# Patient Record
Sex: Male | Born: 1985 | Race: White | Hispanic: No | Marital: Single | Smoking: Never smoker
Health system: Southern US, Community
[De-identification: ages and names within clinical notes are randomized; demographics above are authoritative.]

## PROBLEM LIST (undated history)

## (undated) DIAGNOSIS — J45909 Unspecified asthma, uncomplicated: Secondary | ICD-10-CM

## (undated) DIAGNOSIS — K219 Gastro-esophageal reflux disease without esophagitis: Secondary | ICD-10-CM

## (undated) HISTORY — DX: Gastro-esophageal reflux disease without esophagitis: K21.9

## (undated) HISTORY — PX: KNEE SURGERY: SHX244

## (undated) HISTORY — DX: Unspecified asthma, uncomplicated: J45.909

## (undated) HISTORY — PX: CARDIAC ELECTROPHYSIOLOGY MAPPING AND ABLATION: SHX1292

---

## 2013-03-27 ENCOUNTER — Emergency Department (HOSPITAL_COMMUNITY): Payer: Worker's Compensation

## 2013-03-27 ENCOUNTER — Emergency Department (HOSPITAL_COMMUNITY)
Admission: EM | Admit: 2013-03-27 | Discharge: 2013-03-27 | Disposition: A | Discharge status: Home / Self Care | Payer: Worker's Compensation | Attending: Emergency Medicine | Admitting: Emergency Medicine

## 2013-03-27 DIAGNOSIS — IMO0002 Reserved for concepts with insufficient information to code with codable children: Secondary | ICD-10-CM | POA: Insufficient documentation

## 2013-03-27 DIAGNOSIS — Z880 Allergy status to penicillin: Secondary | ICD-10-CM | POA: Insufficient documentation

## 2013-03-27 DIAGNOSIS — Z79899 Other long term (current) drug therapy: Secondary | ICD-10-CM | POA: Insufficient documentation

## 2013-03-27 DIAGNOSIS — R0781 Pleurodynia: Secondary | ICD-10-CM

## 2013-03-27 DIAGNOSIS — T148XXA Other injury of unspecified body region, initial encounter: Secondary | ICD-10-CM

## 2013-03-27 DIAGNOSIS — W1789XA Other fall from one level to another, initial encounter: Secondary | ICD-10-CM | POA: Insufficient documentation

## 2013-03-27 DIAGNOSIS — S20219A Contusion of unspecified front wall of thorax, initial encounter: Secondary | ICD-10-CM | POA: Insufficient documentation

## 2013-03-27 DIAGNOSIS — S298XXA Other specified injuries of thorax, initial encounter: Secondary | ICD-10-CM | POA: Insufficient documentation

## 2013-03-27 DIAGNOSIS — Y929 Unspecified place or not applicable: Secondary | ICD-10-CM | POA: Insufficient documentation

## 2013-03-27 DIAGNOSIS — Y9389 Activity, other specified: Secondary | ICD-10-CM | POA: Insufficient documentation

## 2013-03-27 DIAGNOSIS — W19XXXA Unspecified fall, initial encounter: Secondary | ICD-10-CM

## 2013-03-27 MED ORDER — ONDANSETRON HCL 4 MG/2ML IJ SOLN
4.0000 mg | Freq: Once | INTRAMUSCULAR | Status: AC
  Administered 2013-03-27: 4 mg via INTRAVENOUS
  Filled 2013-03-27: qty 2

## 2013-03-27 MED ORDER — KETOROLAC TROMETHAMINE 30 MG/ML IJ SOLN
30.0000 mg | Freq: Once | INTRAMUSCULAR | Status: AC
  Administered 2013-03-27: 30 mg via INTRAVENOUS
  Filled 2013-03-27: qty 1

## 2013-03-27 MED ORDER — HYDROCODONE-ACETAMINOPHEN 5-325 MG PO TABS: 1.0000 | ORAL_TABLET | Freq: Four times a day (QID) | ORAL | Status: DC | PRN

## 2013-03-27 MED ORDER — SODIUM CHLORIDE 0.9 % IV BOLUS (SEPSIS)
1000.0000 mL | Freq: Once | INTRAVENOUS | Status: AC
  Administered 2013-03-27: 1000 mL via INTRAVENOUS

## 2013-03-27 MED ORDER — ONDANSETRON HCL 4 MG PO TABS: 4.0000 mg | ORAL_TABLET | Freq: Four times a day (QID) | ORAL | Status: DC

## 2013-03-27 NOTE — ED Provider Notes (Signed)
CSN: [id number]     Arrival date & time 03/27/13  1302 History   First MD Initiated Contact with Patient 03/27/13 1304     Chief Complaint  Patient presents with  . Fall   (Consider location/radiation/quality/duration/timing/severity/associated sxs/prior Treatment) The history is provided by the patient. No language interpreter was used.  Roberto Banks is a 28 y/o M with no known significant PMHx presenting to the ED with a mechanical fall that occurred this afternoon. Patient reported that he was blowing leaves off the roof, lost his footing and ended up falling off the roof approximately 14 feet. Patient reported that on his was down he landed on the railing on his ribs on the left side. Patient reported that the pain feels like he broke his ribs. Patient reported that there is a throbbing pain with a sharp, shooting pain when the patient takes a deep inhalation. Patient reported that he has mild right bicep discomfort. Patient denied head injury, LOC, dizziness, visual changes, blurred vision, sudden loss of vision, nausea, vomiting, back pain, abdominal pain, abdominal trauma, weakness, numbness, tingling.  PCP none  No past medical history on file. No past surgical history on file. No family history on file. History  Substance Use Topics  . Smoking status: Not on file  . Smokeless tobacco: Not on file  . Alcohol Use: Not on file    Review of Systems  Constitutional: Negative for fever and chills.  HENT: Negative for trouble swallowing.   Eyes: Negative for visual disturbance.  Respiratory: Negative for chest tightness and shortness of breath.   Cardiovascular: Positive for chest pain (Rib pain-localize the left side).  Gastrointestinal: Negative for nausea, vomiting, abdominal pain and diarrhea.  Musculoskeletal: Negative for back pain and neck pain.  Neurological: Negative for dizziness, weakness and numbness.  All other systems reviewed and are negative.    Allergies    Penicillins  Home Medications   Current Outpatient Rx  Name  Route  Sig  Dispense  Refill  . RABEprazole (ACIPHEX) 20 MG tablet   Oral   Take 20 mg by mouth daily.         Marland Kitchen. HYDROcodone-acetaminophen (NORCO/VICODIN) 5-325 MG per tablet   Oral   Take 1 tablet by mouth every 6 (six) hours as needed.   11 tablet   0   . ondansetron (ZOFRAN) 4 MG tablet   Oral   Take 1 tablet (4 mg total) by mouth every 6 (six) hours.   12 tablet   0    BP 131/49  Pulse 92  Temp(Src) 98.2 F (36.8 C) (Oral)  Resp 18  Ht 6\' 2"  (1.88 m)  Wt 255 lb (115.667 kg)  BMI 32.73 kg/m2  SpO2 100% Physical Exam  Nursing note and vitals reviewed. Constitutional: He is oriented to person, place, and time. He appears well-developed and well-nourished. No distress.  HENT:  Head: Normocephalic and atraumatic. Not macrocephalic and not microcephalic. Head is without raccoon's eyes, without Battle's sign, without abrasion, without contusion and without laceration.  Negative facial trauma identified Negative trismus Negative palpation of hematomas  Eyes: Conjunctivae and EOM are normal. Pupils are equal, round, and reactive to light. Right eye exhibits no discharge. Left eye exhibits no discharge.  Negative nystagmus Visual fields intact  Neck: Normal range of motion. No tracheal deviation present.  Patient placed in c-collar Negative pain upon palpation to the C-spine  Cardiovascular: Normal rate, regular rhythm and normal heart sounds.  Exam reveals no friction rub.  No murmur heard. Pulses:      Radial pulses are 2+ on the right side, and 2+ on the left side.       Dorsalis pedis pulses are 2+ on the right side, and 2+ on the left side.  Cap refill less than 3 seconds  Pulmonary/Chest: Effort normal and breath sounds normal. No respiratory distress. He has no wheezes. He has no rales. He exhibits tenderness.  Pain upon palpation to the left ribs, posterior aspects Negative crepitus upon  palpation Good lung expansion Negative respiratory distress Negative stridor Patient able to speak in full sentences without difficulty  Musculoskeletal: Normal range of motion. He exhibits no tenderness.       Back:  Full ROM to upper and lower extremities without difficulty noted, negative ataxia noted.  Neurological: He is alert and oriented to person, place, and time. No cranial nerve deficit. He exhibits normal muscle tone. Coordination normal.  Cranial nerves III-XII grossly intact Strength 5+/5+ to upper and lower extremities bilaterally with resistance applied, equal distribution noted Strength intact to MCP, PIP, DIP joints of bilateral hand Sensation intact with differentiation to sharp and dull touch Negative arm drift Negative pronator drift - negative Romberg Negative slurred speech Fine motor skills intact Gait proper, proper balance - negative sway, negative drift, negative step-offs  Skin: Skin is warm and dry. No rash noted. He is not diaphoretic. No erythema.  Superficial abrasion localized to the medial aspect of the right bicep  Psychiatric: He has a normal mood and affect. His behavior is normal. Thought content normal.    ED Course  Procedures (including critical care time)  Dg Chest 2 View  03/27/2013   CLINICAL DATA:  Fall.  Chest pain  EXAM: CHEST  2 VIEW  COMPARISON:  None.  FINDINGS: The heart size and mediastinal contours are within normal limits. Both lungs are clear. The visualized skeletal structures are unremarkable.  IMPRESSION: No active cardiopulmonary disease.   Electronically Signed   By: Marlan Palau M.D.   On: 03/27/2013 14:57   Dg Ribs Unilateral Left  03/27/2013   CLINICAL DATA:  Fall.  Pain  EXAM: LEFT RIBS - 2 VIEW  COMPARISON:  None.  FINDINGS: No fracture or other bone lesions are seen involving the ribs. The lower ribs were imaged in the area of symptoms.  IMPRESSION: Negative.   Electronically Signed   By: Marlan Palau M.D.   On:  03/27/2013 14:56   Dg Pelvis 1-2 Views  03/27/2013   CLINICAL DATA:  Fall.  EXAM: PELVIS - 1-2 VIEW  COMPARISON:  None.  FINDINGS: Soft tissue structures are unremarkable. No evidence of fracture. No acute bony abnormality. Minimal degenerative changes both hips.  IMPRESSION: Minimal degenerative changes both hips.  No acute abnormality .   Electronically Signed   By: Maisie Fus  Register   On: 03/27/2013 14:54   Ct Head Wo Contrast  03/27/2013   CLINICAL DATA:  Fall.  EXAM: CT HEAD WITHOUT CONTRAST  CT CERVICAL SPINE WITHOUT CONTRAST  TECHNIQUE: Multidetector CT imaging of the head and cervical spine was performed following the standard protocol without intravenous contrast. Multiplanar CT image reconstructions of the cervical spine were also generated.  COMPARISON:  None.  FINDINGS: CT HEAD FINDINGS  No mass. No hydrocephalus. No hemorrhage. No focal bony abnormality. Mild mucosal thickening of the ethmoidal sinuses and maxillary sinuses. Small mucous retention cyst left maxillary sinus. Mastoids are clear.  CT CERVICAL SPINE FINDINGS  No acute soft tissue bony abnormality  identified. No evidence of fracture or dislocation. Good anatomic alignment is present. Shotty cervical lymph nodes.  IMPRESSION: 1. No acute intracranial abnormality. 2. No acute cervical spine abnormality.   Electronically Signed   By: Maisie Fus  Register   On: 03/27/2013 14:47   Ct Cervical Spine Wo Contrast  03/27/2013   CLINICAL DATA:  Fall.  EXAM: CT HEAD WITHOUT CONTRAST  CT CERVICAL SPINE WITHOUT CONTRAST  TECHNIQUE: Multidetector CT imaging of the head and cervical spine was performed following the standard protocol without intravenous contrast. Multiplanar CT image reconstructions of the cervical spine were also generated.  COMPARISON:  None.  FINDINGS: CT HEAD FINDINGS  No mass. No hydrocephalus. No hemorrhage. No focal bony abnormality. Mild mucosal thickening of the ethmoidal sinuses and maxillary sinuses. Small mucous retention  cyst left maxillary sinus. Mastoids are clear.  CT CERVICAL SPINE FINDINGS  No acute soft tissue bony abnormality identified. No evidence of fracture or dislocation. Good anatomic alignment is present. Shotty cervical lymph nodes.  IMPRESSION: 1. No acute intracranial abnormality. 2. No acute cervical spine abnormality.   Electronically Signed   By: Maisie Fus  Register   On: 03/27/2013 14:47   Labs Review Labs Reviewed - No data to display Imaging Review Dg Chest 2 View  03/27/2013   CLINICAL DATA:  Fall.  Chest pain  EXAM: CHEST  2 VIEW  COMPARISON:  None.  FINDINGS: The heart size and mediastinal contours are within normal limits. Both lungs are clear. The visualized skeletal structures are unremarkable.  IMPRESSION: No active cardiopulmonary disease.   Electronically Signed   By: Marlan Palau M.D.   On: 03/27/2013 14:57   Dg Ribs Unilateral Left  03/27/2013   CLINICAL DATA:  Fall.  Pain  EXAM: LEFT RIBS - 2 VIEW  COMPARISON:  None.  FINDINGS: No fracture or other bone lesions are seen involving the ribs. The lower ribs were imaged in the area of symptoms.  IMPRESSION: Negative.   Electronically Signed   By: Marlan Palau M.D.   On: 03/27/2013 14:56   Dg Pelvis 1-2 Views  03/27/2013   CLINICAL DATA:  Fall.  EXAM: PELVIS - 1-2 VIEW  COMPARISON:  None.  FINDINGS: Soft tissue structures are unremarkable. No evidence of fracture. No acute bony abnormality. Minimal degenerative changes both hips.  IMPRESSION: Minimal degenerative changes both hips.  No acute abnormality .   Electronically Signed   By: Maisie Fus  Register   On: 03/27/2013 14:54   Ct Head Wo Contrast  03/27/2013   CLINICAL DATA:  Fall.  EXAM: CT HEAD WITHOUT CONTRAST  CT CERVICAL SPINE WITHOUT CONTRAST  TECHNIQUE: Multidetector CT imaging of the head and cervical spine was performed following the standard protocol without intravenous contrast. Multiplanar CT image reconstructions of the cervical spine were also generated.  COMPARISON:  None.   FINDINGS: CT HEAD FINDINGS  No mass. No hydrocephalus. No hemorrhage. No focal bony abnormality. Mild mucosal thickening of the ethmoidal sinuses and maxillary sinuses. Small mucous retention cyst left maxillary sinus. Mastoids are clear.  CT CERVICAL SPINE FINDINGS  No acute soft tissue bony abnormality identified. No evidence of fracture or dislocation. Good anatomic alignment is present. Shotty cervical lymph nodes.  IMPRESSION: 1. No acute intracranial abnormality. 2. No acute cervical spine abnormality.   Electronically Signed   By: Maisie Fus  Register   On: 03/27/2013 14:47   Ct Cervical Spine Wo Contrast  03/27/2013   CLINICAL DATA:  Fall.  EXAM: CT HEAD WITHOUT CONTRAST  CT CERVICAL SPINE  WITHOUT CONTRAST  TECHNIQUE: Multidetector CT imaging of the head and cervical spine was performed following the standard protocol without intravenous contrast. Multiplanar CT image reconstructions of the cervical spine were also generated.  COMPARISON:  None.  FINDINGS: CT HEAD FINDINGS  No mass. No hydrocephalus. No hemorrhage. No focal bony abnormality. Mild mucosal thickening of the ethmoidal sinuses and maxillary sinuses. Small mucous retention cyst left maxillary sinus. Mastoids are clear.  CT CERVICAL SPINE FINDINGS  No acute soft tissue bony abnormality identified. No evidence of fracture or dislocation. Good anatomic alignment is present. Shotty cervical lymph nodes.  IMPRESSION: 1. No acute intracranial abnormality. 2. No acute cervical spine abnormality.   Electronically Signed   By: Maisie Fus  Register   On: 03/27/2013 14:47    EKG Interpretation   None       MDM   1. Fall   2. Rib pain on left side   3. Contusion of bone     Medications  sodium chloride 0.9 % bolus 1,000 mL (0 mLs Intravenous Stopped 03/27/13 1509)  ketorolac (TORADOL) 30 MG/ML injection 30 mg (30 mg Intravenous Given 03/27/13 1337)  ondansetron (ZOFRAN) injection 4 mg (4 mg Intravenous Given 03/27/13 1335)   Filed Vitals:    03/27/13 1330 03/27/13 1342 03/27/13 1415 03/27/13 1509  BP: 130/64 130/64 120/66 131/49  Pulse: 86 86 91 92  Temp:      TempSrc:      Resp: 17 13 16 18   Height:      Weight:      SpO2: 100% 100% 100% 100%   Patient presenting to the ED with a fall that occurred this afternoon. Patient reported that he fell from a 14 foot roof. Patient reported that he hit his ribs on the left side on the railing - reported constant throbbing sensation with worse pain with deep inhalation. Denied head injury, loss of consciousness, blurred vision, visual distortions. Alert and oriented. GCS 15. Heart rate and rhythm normal. Cap refill less than 3 seconds. Radial and DP pulses 2+ bilaterally. Lungs clear to auscultation. Negative tracheal deviation. Lung sounds clear bilaterally-doubt pneumothorax. Good lung expansion. Negative respiratory distress. Patient able to speak in full sentences without difficulty. Discomfort upon palpation to the ribs and left side, posterior aspect-negative crepitus upon palpation, negative ecchymosis. Full range of motion to upper and lower extremities bilaterally without difficulty noted-full range of motion to hips bilaterally. Strength intact to upper and lower extremities bilaterally with equal distribution-equal grip strength noted. Sensation intact to upper and lower tremors bilaterally with differentiation sharp and dull touch. Cranial nerves grossly intact. Patient neurovascular intact. Gait proper-negative sway, negative step-offs. Negative pronator drift.  EKG noted normal sinus rhythm with a heart rate of 96 beats per minute-negative ischemic findings identified. Unilateral ribs of the left plain film negative findings for acute bony abnormalities. Chest x-ray negative for pneumothorax. Pelvis plain film negative findings for acute bony abnormalities. CT head negative acute intracranial abnormalities. CT cervical spine negative acute abnormalities identified. C-collar removed.  Patient seen and assessed by attending physician. Discussed imaging results with patient in great detail. Denied chest pain, shortness of breath, difficulty breathing, dizziness, visual distortions.  Negative rib fractures noted. Negative acute intracranial abnormalities noted. Negative pneumothorax. Negative cervical fractures. Patient stable, afebrile. Blood pressure improved. Patient discharged. Discussed and reviewed imaging with attending physician - patient cleared for discharge. Suspicion to be bone contusion of the ribs. Discharged patient with small dose of pain medications - discussed with patient course, precautions,  disposal technique. Referred patient to orthopedics. Discussed with patient to rest and stay hydrated. Discussed with patient to avoid any physical or strenuous activity. Discussed with patient to closely monitor symptoms and if symptoms are to worsen or change to report back to the ED - strict return instructions given.  Patient agreed to plan of care, understood, all questions answered.   Raymon Mutton, PA-C 03/27/13 2204

## 2013-03-27 NOTE — Discharge Instructions (Signed)
Please call your doctor for a followup appointment within 24-48 hours. When you talk to your doctor please let them know that you were seen in the emergency department and have them acquire all of your records so that they can discuss the findings with you and formulate a treatment plan to fully care for your new and ongoing problems. Please call and set-up an appointment with Dr. Magnus Ivan, orthopedics surgeon, regarding fall and rib pain to be reassessed Xrays of pelvis, chest, and left ribs showed no broken bones CT head and neck showed no abnormalities, bleeds, or broken bones Please rest and stay hydrated Please take medications as prescribed - while on pain medications there is to be no drinking alcohol, driving, operating heavy machinery - if extra please dispose in a proper manner. Please while on this medication do no take any extra Tylenol for this can lead to Tylenol overdose and liver issues.  Please avoid any physical or strenuous activity If you need to sneeze or laugh please place a pillow or something soft on the left side to aid in relief Please continue to monitor symptoms closely and if symptoms are to worsen or change (fever greater than 101, chills, neck pain, neck stiffness, chest pain, shortness of breath, difficulty breathing, stomach pain, nausea, vomiting, diarrhea, headaches, confusion, dizziness, visual changes, issues with urination and bowel movements) please report back to the ED immediately  Rib Contusion A rib contusion (bruise) can occur by a blow to the chest or by a fall against a hard object. Usually these will be much better in a couple weeks. If X-rays were taken today and there are no broken bones (fractures), the diagnosis of bruising is made. However, broken ribs may not show up for several days, or may be discovered later on a routine X-ray when signs of healing show up. If this happens to you, it does not mean that something was missed on the X-ray, but simply  that it did not show up on the first X-rays. Earlier diagnosis will not usually change the treatment. HOME CARE INSTRUCTIONS   Avoid strenuous activity. Be careful during activities and avoid bumping the injured ribs. Activities that pull on the injured ribs and cause pain should be avoided, if possible.  For the first day or two, an ice pack used every 20 minutes while awake may be helpful. Put ice in a plastic bag and put a towel between the bag and the skin.  Eat a normal, well-balanced diet. Drink plenty of fluids to avoid constipation.  Take deep breaths several times a day to keep lungs free of infection. Try to cough several times a day. Splint the injured area with a pillow while coughing to ease pain. Coughing can help prevent pneumonia.  Wear a rib belt or binder only if told to do so by your caregiver. If you are wearing a rib belt or binder, you must do the breathing exercises as directed by your caregiver. If not used properly, rib belts or binders restrict breathing which can lead to pneumonia.  Only take over-the-counter or prescription medicines for pain, discomfort, or fever as directed by your caregiver. SEEK MEDICAL CARE IF:   You or your child has an oral temperature above 102 F (38.9 C).  Your baby is older than 3 months with a rectal temperature of 100.5 F (38.1 C) or higher for more than 1 day.  You develop a cough, with thick or bloody sputum. SEEK IMMEDIATE MEDICAL CARE IF:  You have difficulty breathing.  You feel sick to your stomach (nausea), have vomiting or belly (abdominal) pain.  You have worsening pain, not controlled with medications, or there is a change in the location of the pain.  You develop sweating or radiation of the pain into the arms, jaw or shoulders, or become light headed or faint.  You or your child has an oral temperature above 102 F (38.9 C), not controlled by medicine.  Your or your baby is older than 3 months with a rectal  temperature of 102 F (38.9 C) or higher.  Your baby is 84 months old or younger with a rectal temperature of 100.4 F (38 C) or higher. MAKE SURE YOU:   Understand these instructions.  Will watch your condition.  Will get help right away if you are not doing well or get worse. Document Released: 11/15/2000 Document Revised: 06/17/2012 Document Reviewed: 10/09/2007 [facility] Patient Information 2014 [place], [place].   Emergency Department Resource Guide 1) Find a Doctor and Pay Out of Pocket Although you won't have to find out who is covered by your insurance plan, it is a good idea to ask around and get recommendations. You will then need to call the office and see if the doctor you have chosen will accept you as a new patient and what types of options they offer for patients who are self-pay. Some doctors offer discounts or will set up payment plans for their patients who do not have insurance, but you will need to ask so you aren't surprised when you get to your appointment.  2) Contact Your Local Health Department Not all health departments have doctors that can see patients for sick visits, but many do, so it is worth a call to see if yours does. If you don't know where your local health department is, you can check in your phone book. The CDC also has a tool to help you locate your state's health department, and many state websites also have listings of all of their local health departments.  3) Find a Walk-in Clinic If your illness is not likely to be very severe or complicated, you may want to try a walk in clinic. These are popping up all over the country in pharmacies, drugstores, and shopping centers. They're usually staffed by nurse practitioners or physician assistants that have been trained to treat common illnesses and complaints. They're usually fairly quick and inexpensive. However, if you have serious medical issues or chronic medical problems, these are probably not your best  option.  No Primary Care Doctor: - Call Health Connect at  [phone] - they can help you locate a primary care doctor that  accepts your insurance, provides certain services, etc. - Physician Referral Service- [phone]  Chronic Pain Problems: Organization         Address  Phone   Notes  [facility]  [phone] Patients need to be referred by their primary care doctor.   Medication Assistance: Organization         Address  Phone   Notes  [facility] Medication [facility] [address] [place], [place] [address] [phone] --Must be a resident of [facility] -- Must have NO insurance coverage whatsoever (no Medicaid/ Medicare, etc.) -- The pt. MUST have a primary care doctor that directs their care regularly and follows them in the community   MedAssist  [phone]   [employer]  [phone]    Agencies  that provide inexpensive medical care: Organization         Address  Phone   Notes  [facility]  [phone]) [phone]   [facility]    [phone]   [facility] [address] [place], [place] [id number] [phone]) [phone]   [facility] [address], [place] [phone]) [phone]   [facility]anned Parenthood    [phone]   [facility]    [phone]   [facility]  [address], [place] Phone:  [phone], Fax:  [phone]) [phone] Hours of Operation:  9 am - 6 pm, M-F.  Also accepts Medicaid/Medicare and self-pay.  [facility]  [address], [place] Phone: [phone](336) [phone], Fax: [phone]. Hours of Operation:  8:30 am - 5:30 pm, M-F.  Also accepts Medicaid and self-pay.  [facility] [place] [address], [place] Phone: [phone]) [phone]   [facility] [address], [place], [place] [phone], Ext. [phone] Mondays & Thursdays: 7-9 AM.  First 15  patients are seen on a first come, first serve basis.    Medicaid-accepting [facility] [place] Providers:  Organization         Address  Phone   Notes  [facility] [address], [place] [phone]) [phone] Also accepts self-pay patients.  [facility] [address], [place]  [phone]) [phone]   [facility] [address], [place] [phone]) [phone]   [facility] [address] [place] [address], [place] [phone]   Renaye RakersVeita Bland [address], [place]   [phone]) [phone] Only accepts [place] Access [place] patients after they have their name applied to their card.   Self-Pay (no insurance) in [facility]:  Organization         Address  Phone   Notes  Sickle Cell Patients, [facility] [address], [place] [phone]) [phone]   [facility] [address], [place] [phone]) [phone]   [facility] [place]  [address], [place] [phone]) [phone]   [facility]/Dr. Osei-Bonsu  [address] [place] [address], [place] or [id number], [place] [phone] Phone number for both [place] and [place] locations is the same.  [facility] [address], [place] [phone]   [facility] [address] [place] [address], [place] or [address] [phone]) [phone]) [phone]   [facility] [address], [place] [phone]) [phone], phone; [phone]) [phone], fax Sees patients 1st and 3rd Saturday of every month.  Must not qualify for public or private insurance (i.e. Medicaid, Medicare, [place] Health Choice, Veterans' Benefits)  Household income should be no more than 200% of the poverty level The clinic cannot treat you if you are pregnant or think you are pregnant  Sexually transmitted diseases are not treated at the clinic.    Dental  Care: Organization         Address  Phone  Notes  [facility] Department of [facility] [address], [place] [phone] Accepts children up to age 30 who are enrolled in [place] or [place] Health Choice; pregnant women with a Medicaid card; and children who have applied for Medicaid or   Health Choice, but were declined, whose parents can pay a reduced fee at time of service.  [facility] Department of [facility]  [address], [place] [phone] Accepts children up to age 29 who are enrolled in [place] or [place] Health Choice; pregnant women with a Medicaid card; and children who have applied for Medicaid or [place] Health Choice, but were declined, whose parents can pay a reduced fee at time of service.  [facility]  [address] [place], [place] [phone] Patients are seen by appointment only. Walk-ins are not accepted. [facility] will see patients 11 years of age and older. Monday - Tuesday (8am-5pm) Most Wednesdays (8:30-5pm) $30 per visit, cash only  [facility]  [address], [facility] [phone] Patients are seen by appointment only. Walk-ins are not accepted. [facility] will see patients 67 years of age and older. One Wednesday Evening (Monthly: Volunteer Based).  $30 per visit, cash only  [employer]  [phone] for adults; Children under age 23, call Graduate Pediatric Dentistry at [phone]. Children aged 40-14, please call [phone] to request a pediatric application.  Dental services are provided in all areas of dental care including fillings, crowns and bridges, complete and partial dentures, implants, gum treatment, root canals, and extractions. Preventive care is also provided. Treatment is provided to both adults and children. Patients are selected via a lottery and there is often a waiting list.   [facility] [address], [place]  [phone] [email or web address]   [facility] [address] [place], [place] [phone], Ext. [phone] Second and Fourth Thursday of each month, opens at 6:30 AM; Clinic ends at 9 AM.  Patients are seen on a first-come first-served basis, and a limited number are seen during each clinic.   [facility]  [address] [place], [place] [phone]   Eligibility Requirements You must have lived in [place], [place], or [place] counties for at least the last three months.   You cannot be eligible for state or federal sponsored [place], including [employer], [place], or [employer].   You generally cannot be eligible for healthcare insurance through your employer.    How to apply: Eligibility screenings are held every Tuesday and Wednesday afternoon from 1:00 pm until 4:00 pm. You do not need an appointment for the interview!  [facility] [address], [place], [place] [phone]   [facility] Health Department  [phone]   [facility] Health Department  [phone]   [facility] Health Department  [phone]    Behavioral Health Resources in the Community: Intensive Outpatient Programs Organization         Address  Phone  Notes  [facility] [address] [place], [place] [phone]   [facility]patient [address] [place], [place] [phone]   [facility] [address] [place], [place]  [phone]   [facility] [address],  [place], [place] [phone] or [phone]   Substance Abuse Resources Organization         Address  Phone  Notes  Alcohol [facility] Drug Services  6516528874   [facility]  [phone]   [facility] [place]  [phone]   Floydene Flock  [phone]   Residential & Outpatient Substance Abuse Program  [phone]  Psychological Services Organization         Address  Phone  Notes  [facility] [place]  [place]  [phone]   [place] [address], [place] or [phone]    Mobile Crisis Teams Organization         Address  Phone  Notes  [facility], Mobile Crisis Care Unit  [phone]   [facility]  [address] [place], [place]   [place] Levels [address], [place] [phone]    Self-Help/Support Groups Organization         Address  Phone             Notes  [place] - variety of support groups  Orchard Call for more information  Narcotics Anonymous (NA), [facility] [address], [employer] [place]  2 meetings at this location   Special educational needs teacher         Address  Phone  Notes  [facility]idential [facility] [place],    [place]  [phone]   [facility]  [address], [place] [address]01, [place], [place]   [place], [place] [phone] Admissions: 8am-3pm M-F  Incentives Substance [facility]f [address]    [place], [place] [phone]   [facility] [address] [place], [place], [place]   [facility] [address]  [place], [place]   [facility]ight Programs - Intensive Outpatient [address], [place], [place]   [facility].,  [place], [place] [phone] or [phone]   [facility] Services (RTS) [address] [place], [place] Accepts Medicaid  [facility] [place] [address],  [place] 1-5342379083 Substance Abuse/Addiction Treatment   [facility] Organization         Address  Phone  Notes  [facility]  6046612729   Domenic Schwab, PhD [address] Arlis Porta [place], [place]   [phone] or [phone]   [place], [place] [phone]   [facility] [address], [place], [place] [phone] Insurance/Medicaid/sponsorship through [facility] [address], [address] [place]                                    [place], [place] [phone] [place] [address], [place] [phone]    Dr. Adele Schilder  [phone]   [facility] [place] Dept. 1) [address] [place] 2) [place] 3)  [address], [place] [phone]  [phone]   [place] [phone] or [phone] (After Hours)

## 2013-03-27 NOTE — ED Notes (Signed)
Per EMS: Pt fell from 14 ft from roof, striking wood fence on left side, and then landing on feet. Pt denies LOC. AO and ambulatory at scene. Reports left sided chest wall pain. Lungs clear. 100% RA. Initial BP 62/40. Pt given 1L bolus, last BP 148/92. PERRLA. 80 NSR. Placed in C [place].

## 2013-03-27 NOTE — ED Notes (Signed)
Pt ambulated with no noted difficulty. Denies dizziness, blurred vision or lightheadedness.

## 2013-03-31 ENCOUNTER — Emergency Department (HOSPITAL_COMMUNITY)
Admission: EM | Admit: 2013-03-31 | Discharge: 2013-03-31 | Disposition: A | Discharge status: Home / Self Care | Payer: Worker's Compensation | Attending: Emergency Medicine | Admitting: Emergency Medicine

## 2013-03-31 ENCOUNTER — Emergency Department (HOSPITAL_COMMUNITY): Payer: Worker's Compensation

## 2013-03-31 ENCOUNTER — Encounter (HOSPITAL_COMMUNITY): Payer: Self-pay | Admitting: Emergency Medicine

## 2013-03-31 DIAGNOSIS — Y92009 Unspecified place in unspecified non-institutional (private) residence as the place of occurrence of the external cause: Secondary | ICD-10-CM | POA: Insufficient documentation

## 2013-03-31 DIAGNOSIS — Y939 Activity, unspecified: Secondary | ICD-10-CM | POA: Insufficient documentation

## 2013-03-31 DIAGNOSIS — S298XXA Other specified injuries of thorax, initial encounter: Secondary | ICD-10-CM | POA: Insufficient documentation

## 2013-03-31 DIAGNOSIS — S2249XA Multiple fractures of ribs, unspecified side, initial encounter for closed fracture: Secondary | ICD-10-CM | POA: Insufficient documentation

## 2013-03-31 DIAGNOSIS — Z880 Allergy status to penicillin: Secondary | ICD-10-CM | POA: Insufficient documentation

## 2013-03-31 DIAGNOSIS — Z79899 Other long term (current) drug therapy: Secondary | ICD-10-CM | POA: Insufficient documentation

## 2013-03-31 DIAGNOSIS — S2242XA Multiple fractures of ribs, left side, initial encounter for closed fracture: Secondary | ICD-10-CM

## 2013-03-31 DIAGNOSIS — W138XXA Fall from, out of or through other building or structure, initial encounter: Secondary | ICD-10-CM | POA: Insufficient documentation

## 2013-03-31 MED ORDER — OXYCODONE-ACETAMINOPHEN 5-325 MG PO TABS
1.0000 | ORAL_TABLET | Freq: Four times a day (QID) | ORAL | Status: AC | PRN
Start: 2013-03-31 — End: ?

## 2013-03-31 MED ORDER — KETOROLAC TROMETHAMINE 10 MG PO TABS
10.0000 mg | ORAL_TABLET | Freq: Once | ORAL | Status: AC
  Administered 2013-03-31: 10 mg via ORAL
  Filled 2013-03-31: qty 1

## 2013-03-31 MED ORDER — PROMETHAZINE HCL 25 MG PO TABS: 25.0000 mg | ORAL_TABLET | Freq: Four times a day (QID) | ORAL | Status: DC | PRN

## 2013-03-31 MED ORDER — METHOCARBAMOL 500 MG PO TABS
500.0000 mg | ORAL_TABLET | Freq: Once | ORAL | Status: AC
  Administered 2013-03-31: 500 mg via ORAL
  Filled 2013-03-31: qty 1

## 2013-03-31 MED ORDER — METHOCARBAMOL 500 MG PO TABS: 500.0000 mg | ORAL_TABLET | Freq: Three times a day (TID) | ORAL | Status: AC

## 2013-03-31 MED ORDER — OXYCODONE-ACETAMINOPHEN 5-325 MG PO TABS
1.0000 | ORAL_TABLET | Freq: Once | ORAL | Status: AC
  Administered 2013-03-31: 1 via ORAL
  Filled 2013-03-31: qty 1

## 2013-03-31 NOTE — ED Notes (Signed)
Alert, Pain lt post inferior ribs and around to antero  Lateral ribs.  Increased pain with movement. Out of vicodin.  Says he is having "muscle spasms"

## 2013-03-31 NOTE — Discharge Instructions (Signed)
The CT scan reveals 3 rib fractures on the left side. Please use your incident of barometer every 6 hours. Please use 3 ibuprofen 3 times daily with a meal. Please use Robaxin 3 times daily. May use Percocet for pain if needed. This medication may cause drowsiness, please use with caution. Please call the central [facility]. They will direct you as to how to be seen in the trauma clinic. He will probably have to visit Dr. Janee Mornhompson before you are admitted to the clinic. Please observe for temperature elevations, acute changes in your sputum, or coughing up blood. Please return to the emergency department if any of these occur. Rib Fracture A rib fracture is a break or crack in one of the bones of the ribs. The ribs are like a cage that goes around your upper chest. A broken or cracked rib is often painful, but most do not cause other problems. Most rib fractures heal on their own in 1 3 months. HOME CARE  Avoid activities that cause pain to the injured area. Protect your injured area.  Slowly increase activity as told by your doctor.  Take medicine as told by your doctor.  Put ice on the injured area for the first 1 2 days after you have been treated or as told by your doctor.  Put ice in a plastic bag.  Place a towel between your skin and the bag.  Leave the ice on for 15 20 minutes at a time, every 2 hours while you are awake.  Do deep breathing as told by your doctor. You may be told to:  Take deep breaths many times a day.  Cough many times a day while hugging a pillow.  Use a device (incentive spirometer) to perform deep breathing many times a day.  Drink enough fluids to keep your pee (urine) clear or pale yellow.   Do not wear a rib belt or binder. These do not allow you to breathe deeply. GET HELP RIGHT AWAY IF:   You have a fever.  You have trouble breathing.   You cannot stop coughing.  You cough up thick or bloody spit (mucus).   You feel sick to your  stomach (nauseous), throw up (vomit), or have belly (abdominal) pain.   Your pain gets worse and medicine does not help.  MAKE SURE YOU:   Understand these instructions.  Will watch your condition.  Will get help right away if you are not doing well or get worse. Document Released: 11/30/2007 Document Revised: 06/17/2012 Document Reviewed: 04/24/2012 [facility] Patient Information 2014 [place].

## 2013-03-31 NOTE — ED Provider Notes (Signed)
Medical screening examination/treatment/procedure(s) were performed by non-physician practitioner and as supervising physician I was immediately available for consultation/collaboration.  EKG Interpretation   None       Emanuella Nickle, MD, FACEP   Lorrine Killilea L Olis Viverette, MD 03/31/13 1606 

## 2013-03-31 NOTE — ED Notes (Signed)
Larey SeatFell off a roof, 6 ft to railing of deck and then 8 ft onto ground. On Thursday.  Went to [facility] and evalulated and released. Cont to have pain in lt side of back.  Out of vicodin.

## 2013-03-31 NOTE — ED Provider Notes (Signed)
CSN: [id number]     Arrival date & time 03/31/13  1056 History   First MD Initiated Contact with Patient 03/31/13 1116     Chief Complaint  Patient presents with  . Back Pain   (Consider location/radiation/quality/duration/timing/severity/associated sxs/prior Treatment) HPI Comments: Patient is a 28 year old male who fell approximately 14 feet off of his house on January 22. The patient was examined at the [facility] emergency department. He had chest x-rays and left rib x-rays were negative at the time. The patient presents to the emergency department today because he says the pain is getting" unbearable". The patient denies any fever. The patient denies any hemoptysis. He has pain with taking a deep breath and pain with certain movements. He states the pain medication helps him, but did not take the pain completely away. He states that now he cannot sleep or find a comfortable position because of this discomfort. He requests reevaluation, an assistance with his pain.  Patient is a 28 y.o. male presenting with back pain. The history is provided by the patient.  Back Pain Associated symptoms: no abdominal pain, no chest pain and no dysuria     History reviewed. No pertinent past medical history. Past Surgical History  Procedure Laterality Date  . Cardiac electrophysiology mapping and ablation    . Knee surgery     History reviewed. No pertinent family history. History  Substance Use Topics  . Smoking status: Never Smoker   . Smokeless tobacco: Not on file  . Alcohol Use: No    Review of Systems  Constitutional: Negative for activity change.       All ROS Neg except as noted in HPI  HENT: Negative for nosebleeds.   Eyes: Negative for photophobia and discharge.  Respiratory: Negative for cough, shortness of breath and wheezing.   Cardiovascular: Negative for chest pain and palpitations.  Gastrointestinal: Negative for abdominal pain and blood in stool.  Genitourinary: Negative for  dysuria, frequency and hematuria.  Musculoskeletal: Positive for arthralgias and back pain. Negative for neck pain.  Skin: Negative.   Neurological: Negative for dizziness, seizures and speech difficulty.  Psychiatric/Behavioral: Negative for hallucinations and confusion.    Allergies  Penicillins  Home Medications   Current Outpatient Rx  Name  Route  Sig  Dispense  Refill  . HYDROcodone-acetaminophen (NORCO/VICODIN) 5-325 MG per tablet   Oral   Take 1 tablet by mouth every 6 (six) hours as needed.   11 tablet   0   . ibuprofen (ADVIL,MOTRIN) 200 MG tablet   Oral   Take 800 mg by mouth daily as needed for moderate pain.         Marland Kitchen ondansetron (ZOFRAN) 4 MG tablet   Oral   Take 1 tablet (4 mg total) by mouth every 6 (six) hours.   12 tablet   0   . RABEprazole (ACIPHEX) 20 MG tablet   Oral   Take 20 mg by mouth daily.          BP 129/78  Pulse 104  Temp(Src) 98.4 F (36.9 C) (Oral)  Resp 20  Ht 6\' 2"  (1.88 m)  Wt 255 lb (115.667 kg)  BMI 32.73 kg/m2  SpO2 99% Physical Exam  Nursing note and vitals reviewed. Constitutional: He is oriented to person, place, and time. He appears well-developed and well-nourished.  Non-toxic appearance.  HENT:  Head: Normocephalic.  Right Ear: Tympanic membrane and external ear normal.  Left Ear: Tympanic membrane and external ear normal.  Eyes: EOM  and lids are normal. Pupils are equal, round, and reactive to light.  Neck: Normal range of motion. Neck supple. Carotid bruit is not present.  Cardiovascular: Normal rate, regular rhythm, normal heart sounds, intact distal pulses and normal pulses.   Pulmonary/Chest: Breath sounds normal. No respiratory distress.  There is symmetrical rise and fall of the chest. There is pain to palpation of the left lateral and left posterior rib areas. Inferior greater than superior. Lungs are mostly clear. Patient speaks in complete sentences without problem.  Abdominal: Soft. Bowel sounds are  normal. He exhibits no distension. There is no tenderness. There is no rebound and no guarding.  Musculoskeletal: Normal range of motion.  Lymphadenopathy:       Head (right side): No submandibular adenopathy present.       Head (left side): No submandibular adenopathy present.    He has no cervical adenopathy.  Neurological: He is alert and oriented to person, place, and time. He has normal strength. No cranial nerve deficit or sensory deficit.  Skin: Skin is warm and dry.  Psychiatric: He has a normal mood and affect. His speech is normal.    ED Course  Procedures (including critical care time) Labs Review Labs Reviewed - No data to display Imaging Review No results found.  EKG Interpretation   None       MDM  No diagnosis found. **I have reviewed nursing notes, vital signs, and all appropriate lab and imaging results for this patient.*  CT scan reveals fractures of the posterior left 10th and 11th ribs. Fracture of the posterior lateral left eighth rib. There is also question of related atelectasis present. However the radiologist raises the question because of the rib fractures, if contusion should be considered. No pneumothorax appreciated.  Case discussed with Dr. Lynelle DoctorKnapp. Case discussed with Dr. Janee Mornhompson (trauma surgery). Patient is given an Insurance account managerincentives barometer. He will be treated with Robaxin and Percocet. He is to call the [place] surgery office, for trauma clinic followup.  Kathie DikeHobson M Montrey Buist, PA-C 03/31/13 1540

## 2013-04-01 ENCOUNTER — Telehealth (HOSPITAL_COMMUNITY): Payer: Self-pay | Admitting: Emergency Medicine

## 2013-04-01 NOTE — Telephone Encounter (Signed)
As per new policy, left message referring patient to [facility] to make appt with Dr. Janee Mornhompson or Dr. Lindie SpruceWyatt directly.

## 2013-04-01 NOTE — ED Provider Notes (Signed)
  Medical screening examination/treatment/procedure(s) were performed by non-physician practitioner and as supervising physician I was immediately available for consultation/collaboration.  EKG Interpretation    Date/Time:  Thursday March 27 2013 13:14:41 EST Ventricular Rate:  96 PR Interval:  164 QRS Duration: 85 QT Interval:  341 QTC Calculation: 431 R Axis:   44 Text Interpretation:  Age not entered, assumed to be  28 years old for purpose of ECG interpretation Sinus rhythm ED PHYSICIAN INTERPRETATION AVAILABLE IN [facility] Confirmed by TEST, RECORD ([id number]) on 03/29/2013 12:16:58 PM               Gerhard Munchobert Jossue Rubenstein, MD 04/01/13 1116

## 2013-04-08 ENCOUNTER — Ambulatory Visit (INDEPENDENT_AMBULATORY_CARE_PROVIDER_SITE_OTHER): Payer: Worker's Compensation | Admitting: General Surgery

## 2013-04-08 ENCOUNTER — Encounter (INDEPENDENT_AMBULATORY_CARE_PROVIDER_SITE_OTHER): Payer: Self-pay | Admitting: General Surgery

## 2013-04-08 ENCOUNTER — Encounter (INDEPENDENT_AMBULATORY_CARE_PROVIDER_SITE_OTHER): Payer: Self-pay

## 2013-04-08 VITALS — BP 130/80 | HR 80 | Temp 97.7°F | Resp 14 | Ht 74.0 in | Wt 273.0 lb

## 2013-04-08 DIAGNOSIS — S2239XA Fracture of one rib, unspecified side, initial encounter for closed fracture: Secondary | ICD-10-CM

## 2013-04-08 DIAGNOSIS — S2249XA Multiple fractures of ribs, unspecified side, initial encounter for closed fracture: Secondary | ICD-10-CM | POA: Insufficient documentation

## 2013-04-08 MED ORDER — OXYCODONE-ACETAMINOPHEN 5-325 MG PO TABS: 1.0000 | ORAL_TABLET | Freq: Four times a day (QID) | ORAL | Status: AC | PRN

## 2013-04-08 NOTE — Progress Notes (Signed)
Subjective:     Patient ID: Roberto Banks, male   DOB: 01-31-1986, 28 y.o.   MRN: [id number]  The patient is here for evaluation in approximately a week and a half after falling off of a 10 foot roof onto his left side that time he was evaluated in [facility] emergency department [facility] home hospital. He was found to have 2 rib fractures no pneumothorax or hemothorax.  I talked to the patient on the telephone yesterday he was doing just fine however because he needs to get back to work and evaluation was necessary.  HPI The patient fell off a roof and now has 2 rib fractures on his left side. He is being evaluated today for functional status and amount of pain.  Review of Systems Negative.    Objective:   Physical Exam Chest and pulmonary evaluation: Normal breath sounds down to the bases on the left side. No crepitance on the left side. He has some bruising along his left flank. The patient notes that when he takes deep breath or moves in certain ways that he can feel the ribs rubbing against each other. I cannot objectively find that on examination today.    Assessment:     2 rib fractures on the left side with minimal displacement and functional derangement.     Plan:     The patient has a job where he does a fair amount of heavy lifting. The plan is to allow him to go back to work at light duty for the next 10 days to 2 weeks. At that time he can resume normal activity without restrictions. He currently is transitioning to nonsteroidal anti-inflammatories for pain control but only has a few Percocet tablets available. A we prescribe him 20 more Percocet tablets to allow him to get through the next several days if he should have intense pain on that side.

## 2015-09-02 IMAGING — CR DG RIBS 2V*L*
2 series · 2 of 2 positions shown · non-contrast
Comparison: None.

CLINICAL DATA: Fall.  Pain

EXAM:
LEFT RIBS - 2 VIEW

[w ribs ap/pa upper left]
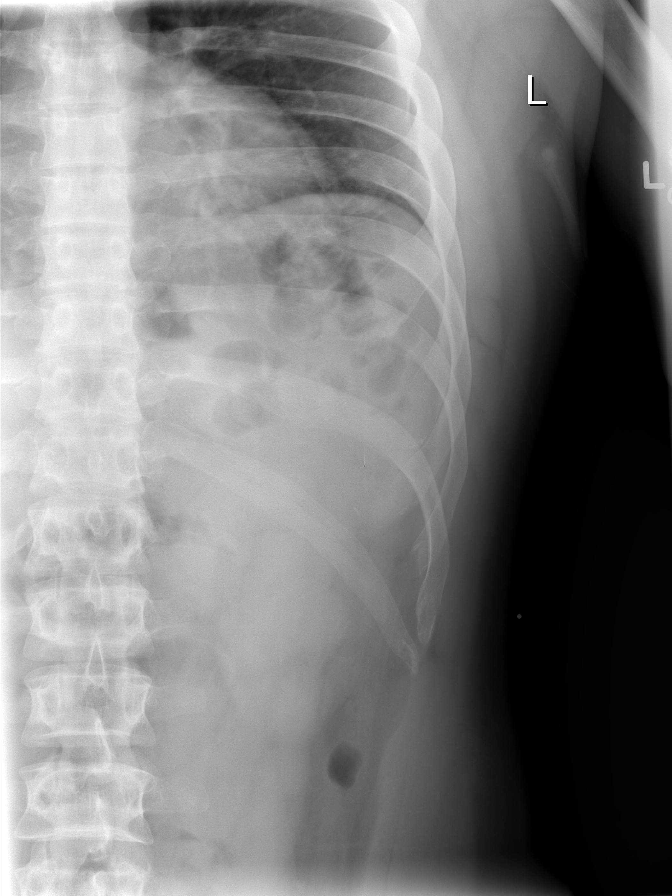

[w ribs ap/pa lower left]
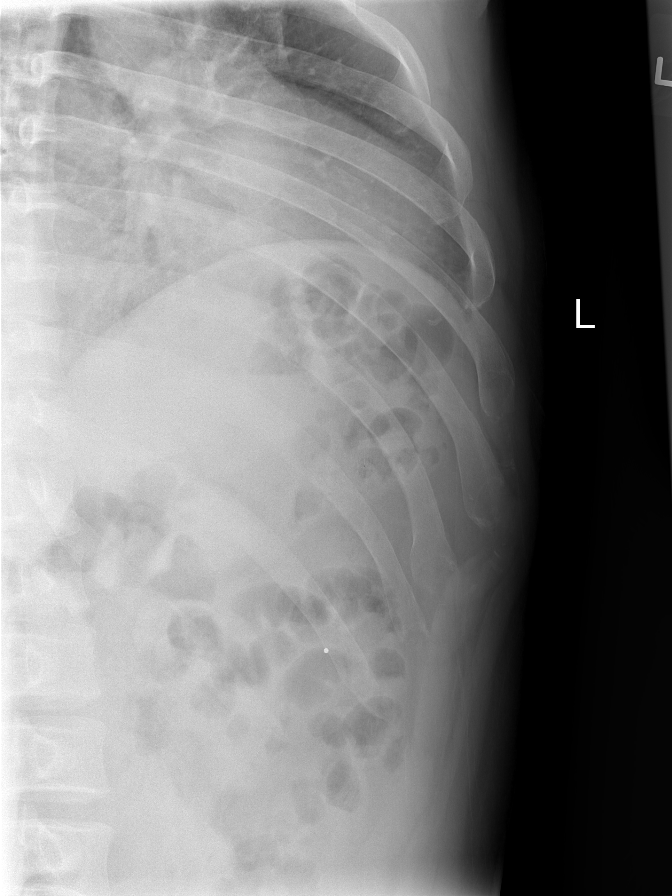

[2 of 2 positions shown; findings below may reference images not displayed]

FINDINGS: No fracture or other bone lesions are seen involving the ribs. The
lower ribs were imaged in the area of symptoms.
IMPRESSION: Negative.

## 2015-09-06 IMAGING — CT CT CHEST W/O CM
2 of 6 series · 14 of 36 positions shown, 18 images · non-contrast
Comparison: Chest x-ray 03/27/2013.

CLINICAL DATA: Fall.

EXAM:
CT CHEST WITHOUT CONTRAST
TECHNIQUE: Multidetector CT imaging of the chest was performed following the
standard protocol without IV contrast.

[Series 2: chestroutine 5.0 b40f · axial · 0.74mm/px · z∈[-316,-70]mm · 13 of 57 slices shown, 17 images]
[im 4/57  mediastinal]
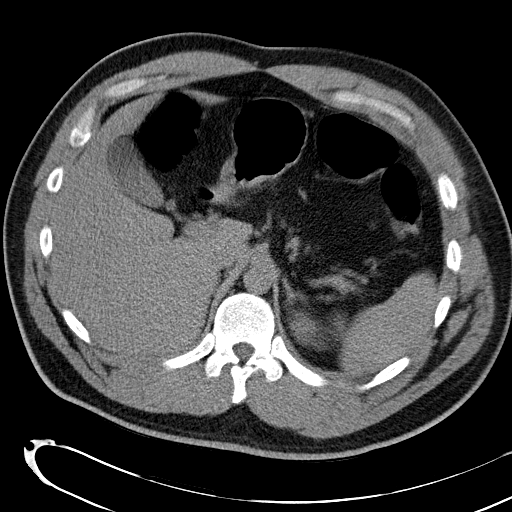
[im 4/57  lung]
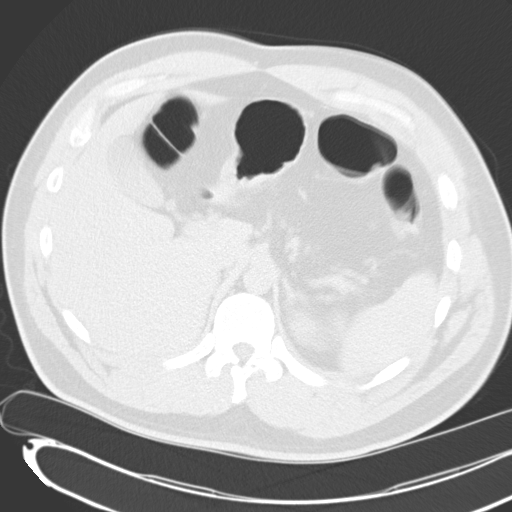
[im 7/57  lung]
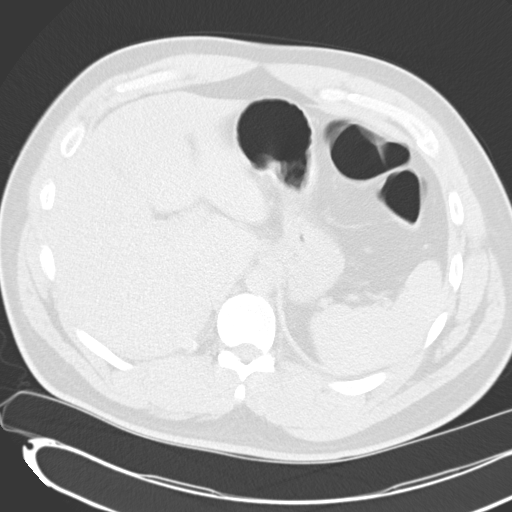
[im 14/57  lung]
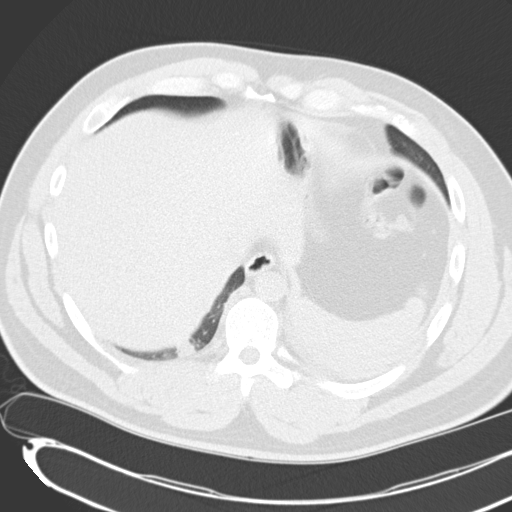
[im 17/57  lung]
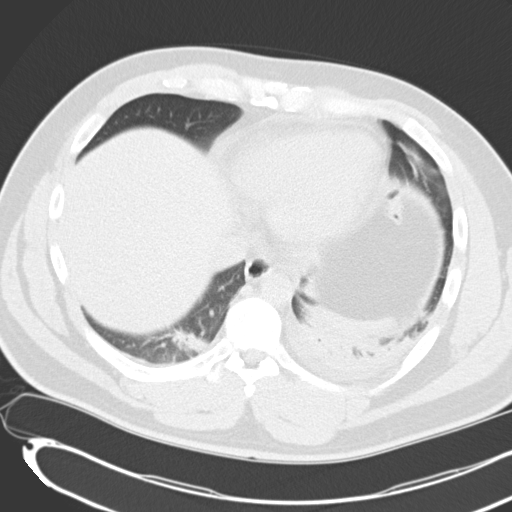
[im 20/57  mediastinal]
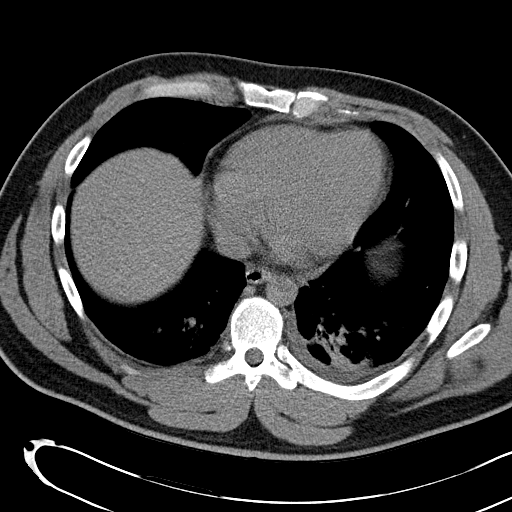
[im 20/57  lung]
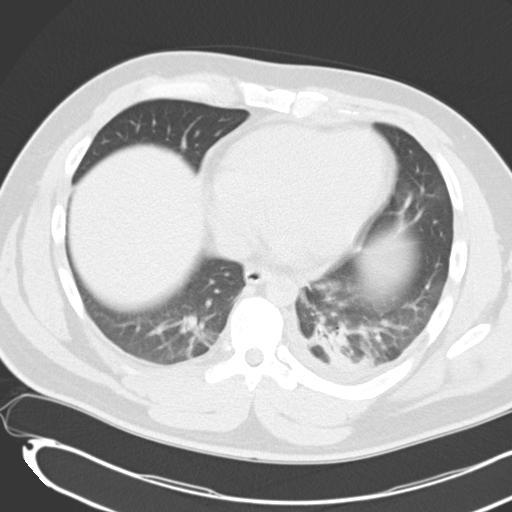
[im 24/57  lung]
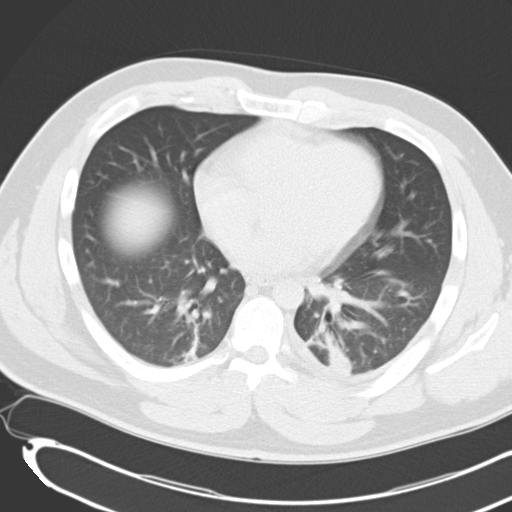
[im 30/57  lung]
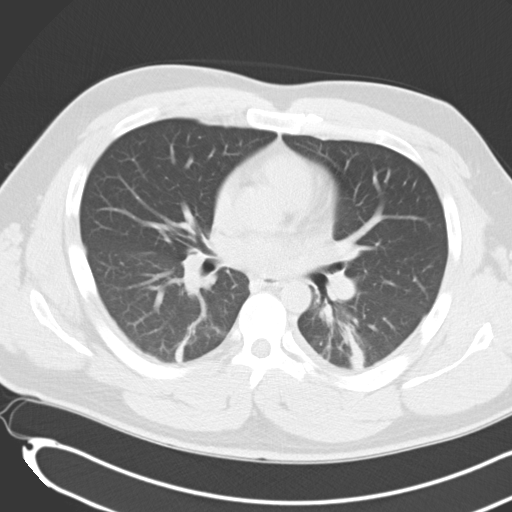
[im 33/57  lung]
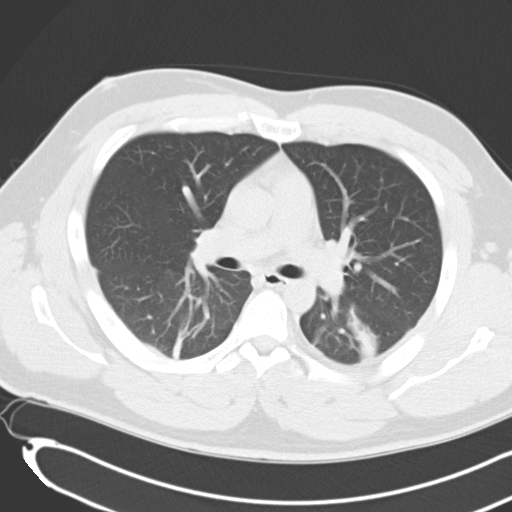
[im 37/57  mediastinal]
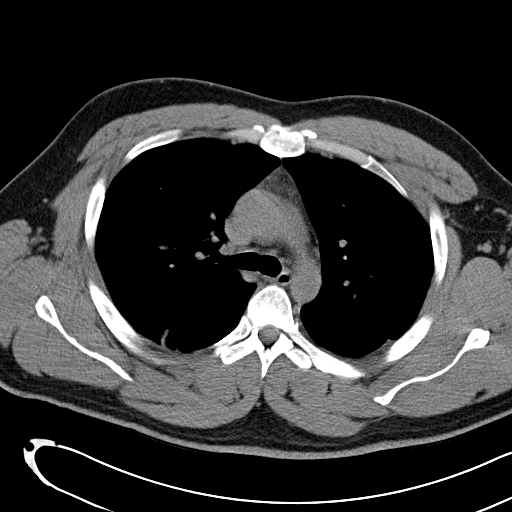
[im 37/57  lung]
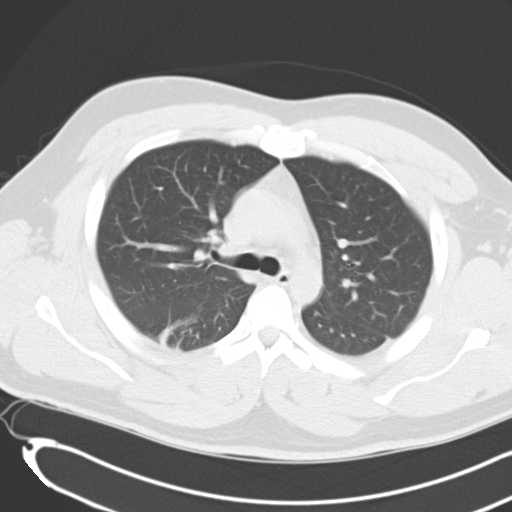
[im 40/57  lung]
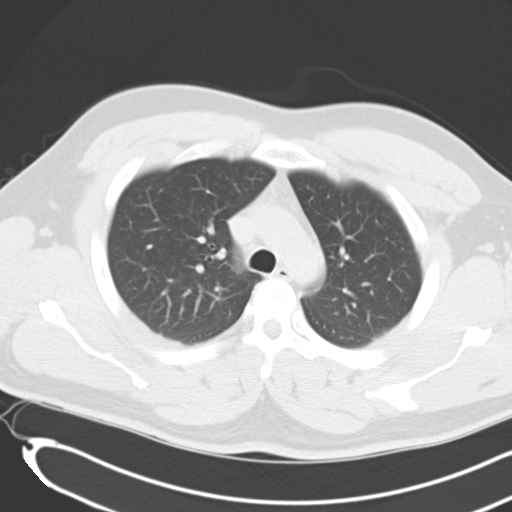
[im 43/57  lung]
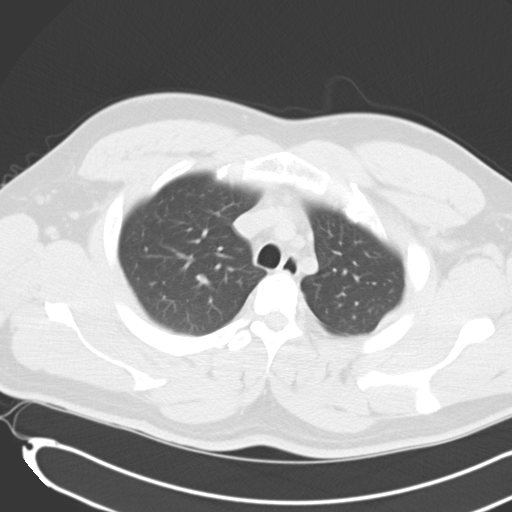
[im 50/57  lung]
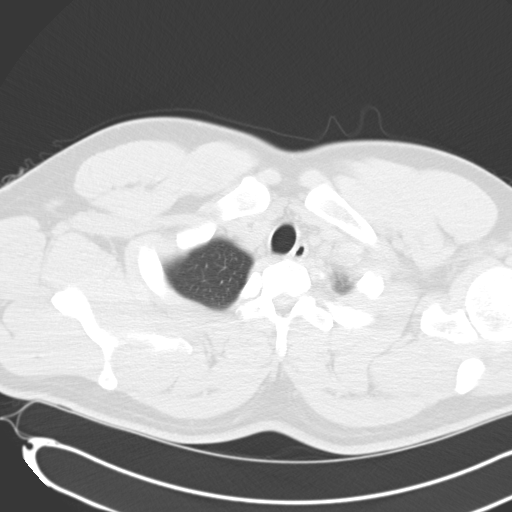
[im 53/57  mediastinal]
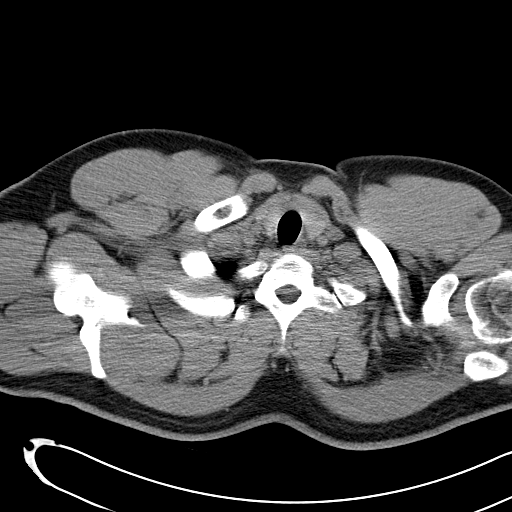
[im 53/57  lung]
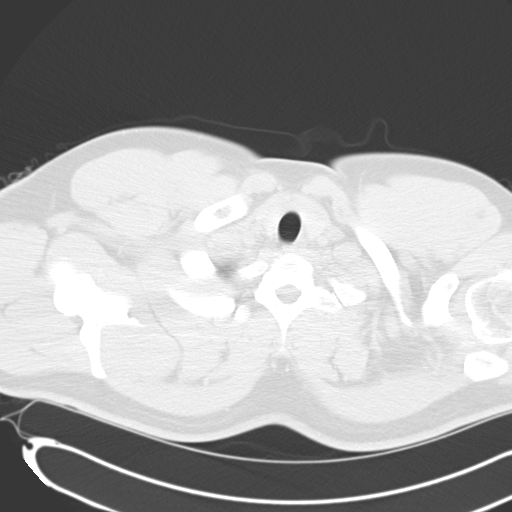

[Series 4: mpr coro 3mm · coronal · 0.55mm/px · 1 of 66 slices shown]
[im 33/66  lung]
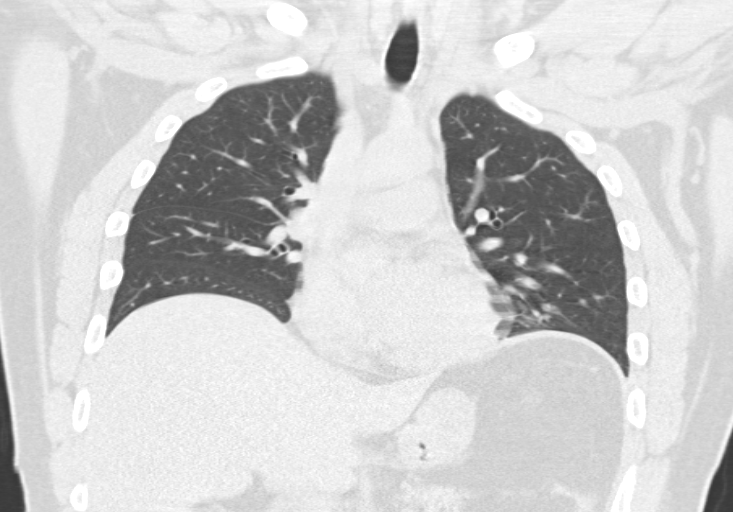

[14 of 36 positions shown; findings below may reference images not displayed]

FINDINGS: Vascular structures are unremarkable. Heart size normal. Tiny
pericardial effusion versus pericardial scarring.

Shotty mediastinal lymph nodes noted. No pathologic lymph nodes
noted using CT size criteria. Thoracic esophagus is normal. Adrenals
are normal. Visualized upper abdominal viscera unremarkable.

Large airways are patent. Bibasilar densities are noted consistent
with atelectasis. Contusion cannot be excluded. Pneumonia cannot be
excluded .Tiny left pleural effusion. No pneumothorax.

Thyroid unremarkable. Supraclavicular and axillary regions are
unremarkable. Fractures of the posterior left tenth and eleventh
ribs noted. Fracture of the posterior lateral left eighth rib noted.
These fractures are minimally displaced.
IMPRESSION: 1. Multiple left lower rib fractures with slight displacement. No
evidence of pneumothorax.
2. Bibasilar pulmonary parenchymal consolidations, left side greater
than right, are noted. This may be related to atelectasis however
given the rib fractures, contusions, particular on the left should
be considered. There is a small left pleural effusion. There is no
pneumothorax.
3. Tiny pericardial effusion versus pericardial scarring.
